# Patient Record
Sex: Male | Born: 1965 | Race: White | Hispanic: No | Marital: Married | Smoking: Current every day smoker
Health system: Southern US, Community
[De-identification: ages and names within clinical notes are randomized; demographics above are authoritative.]

## PROBLEM LIST (undated history)

## (undated) DIAGNOSIS — I10 Essential (primary) hypertension: Secondary | ICD-10-CM

## (undated) DIAGNOSIS — Z951 Presence of aortocoronary bypass graft: Secondary | ICD-10-CM

---

## 2011-11-13 ENCOUNTER — Emergency Department: Payer: Self-pay | Admitting: Emergency Medicine

## 2012-05-10 ENCOUNTER — Encounter: Payer: Self-pay | Admitting: Cardiothoracic Surgery

## 2012-05-12 ENCOUNTER — Encounter: Payer: Self-pay | Admitting: Cardiothoracic Surgery

## 2012-06-11 ENCOUNTER — Encounter: Payer: Self-pay | Admitting: Cardiothoracic Surgery
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
## 2015-04-16 ENCOUNTER — Encounter: Payer: Self-pay | Admitting: Emergency Medicine

## 2015-04-16 ENCOUNTER — Emergency Department: Payer: 59

## 2015-04-16 ENCOUNTER — Emergency Department
Admission: EM | Admit: 2015-04-16 | Discharge: 2015-04-16 | Disposition: A | Discharge status: Home / Self Care | Payer: 59 | Attending: Emergency Medicine | Admitting: Emergency Medicine

## 2015-04-16 DIAGNOSIS — S79921A Unspecified injury of right thigh, initial encounter: Secondary | ICD-10-CM | POA: Insufficient documentation

## 2015-04-16 DIAGNOSIS — Z720 Tobacco use: Secondary | ICD-10-CM | POA: Insufficient documentation

## 2015-04-16 DIAGNOSIS — W230XXA Caught, crushed, jammed, or pinched between moving objects, initial encounter: Secondary | ICD-10-CM | POA: Insufficient documentation

## 2015-04-16 DIAGNOSIS — S8991XA Unspecified injury of right lower leg, initial encounter: Secondary | ICD-10-CM | POA: Insufficient documentation

## 2015-04-16 DIAGNOSIS — Y9389 Activity, other specified: Secondary | ICD-10-CM | POA: Diagnosis not present

## 2015-04-16 DIAGNOSIS — T148 Other injury of unspecified body region: Secondary | ICD-10-CM | POA: Insufficient documentation

## 2015-04-16 DIAGNOSIS — Y92410 Unspecified street and highway as the place of occurrence of the external cause: Secondary | ICD-10-CM | POA: Diagnosis not present

## 2015-04-16 DIAGNOSIS — Y998 Other external cause status: Secondary | ICD-10-CM | POA: Insufficient documentation

## 2015-04-16 DIAGNOSIS — S79911A Unspecified injury of right hip, initial encounter: Secondary | ICD-10-CM | POA: Insufficient documentation

## 2015-04-16 DIAGNOSIS — S79912A Unspecified injury of left hip, initial encounter: Secondary | ICD-10-CM | POA: Diagnosis present

## 2015-04-16 DIAGNOSIS — T148XXA Other injury of unspecified body region, initial encounter: Secondary | ICD-10-CM

## 2015-04-16 DIAGNOSIS — I10 Essential (primary) hypertension: Secondary | ICD-10-CM | POA: Diagnosis not present

## 2015-04-16 HISTORY — DX: Essential (primary) hypertension: I10

## 2015-04-16 HISTORY — DX: Presence of aortocoronary bypass graft: Z95.1

## 2015-04-16 LAB — CK: CK TOTAL: 194 U/L (ref 49–397)

## 2015-04-16 MED ORDER — MORPHINE SULFATE 4 MG/ML IJ SOLN
INTRAMUSCULAR | Status: AC
  Administered 2015-04-16: 4 mg
  Filled 2015-04-16: qty 1

## 2015-04-16 MED ORDER — OXYCODONE-ACETAMINOPHEN 5-325 MG PO TABS: 1.0000 | ORAL_TABLET | Freq: Four times a day (QID) | ORAL | Status: AC | PRN

## 2015-04-16 MED ORDER — MORPHINE SULFATE 4 MG/ML IJ SOLN
6.0000 mg | Freq: Once | INTRAMUSCULAR | Status: AC
  Administered 2015-04-16: 4 mg via INTRAVENOUS

## 2015-04-16 MED ORDER — MORPHINE SULFATE 10 MG/ML IJ SOLN
INTRAMUSCULAR | Status: AC
  Administered 2015-04-16: 10 mg via INTRAVENOUS
  Filled 2015-04-16: qty 1

## 2015-04-16 MED ORDER — ONDANSETRON HCL 4 MG/2ML IJ SOLN
INTRAMUSCULAR | Status: AC
  Administered 2015-04-16: 4 mg
  Filled 2015-04-16: qty 2

## 2015-04-16 MED ORDER — MORPHINE SULFATE 2 MG/ML IJ SOLN
INTRAMUSCULAR | Status: AC
  Administered 2015-04-16: 2 mg via INTRAVENOUS
  Filled 2015-04-16: qty 1

## 2015-04-16 MED ORDER — MORPHINE SULFATE 10 MG/ML IJ SOLN
10.0000 mg | Freq: Once | INTRAMUSCULAR | Status: AC
  Administered 2015-04-16: 10 mg via INTRAVENOUS

## 2015-04-16 MED ORDER — MORPHINE SULFATE 4 MG/ML IJ SOLN
INTRAMUSCULAR | Status: AC
  Administered 2015-04-16: 4 mg via INTRAVENOUS
  Filled 2015-04-16: qty 1

## 2015-04-16 NOTE — ED Notes (Signed)
Pt states right knee tingling, full motor and sensation of right foot. Color WNL. Alert and oriented X4. RR even and unlabored. Pt undressed.
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
## 2015-04-16 NOTE — Discharge Instructions (Signed)
Contusion °A contusion is a deep bruise. Contusions are the result of an injury that caused bleeding under the skin. The contusion may turn blue, purple, or yellow. Minor injuries will give you a painless contusion, but more severe contusions may stay painful and swollen for a few weeks.  °CAUSES  °A contusion is usually caused by a blow, trauma, or direct force to an area of the body. °SYMPTOMS  °· Swelling and redness of the injured area. °· Bruising of the injured area. °· Tenderness and soreness of the injured area. °· Pain. °DIAGNOSIS  °The diagnosis can be made by taking a history and physical exam. An X-ray, CT scan, or MRI may be needed to determine if there were any associated injuries, such as fractures. °TREATMENT  °Specific treatment will depend on what area of the body was injured. In general, the best treatment for a contusion is resting, icing, elevating, and applying cold compresses to the injured area. Over-the-counter medicines may also be recommended for pain control. Ask your caregiver what the best treatment is for your contusion. °HOME CARE INSTRUCTIONS  °· Put ice on the injured area. °· Put ice in a plastic bag. °· Place a towel between your skin and the bag. °· Leave the ice on for 15-20 minutes, 3-4 times a day, or as directed by your health care provider. °· Only take over-the-counter or prescription medicines for pain, discomfort, or fever as directed by your caregiver. Your caregiver may recommend avoiding anti-inflammatory medicines (aspirin, ibuprofen, and naproxen) for 48 hours because these medicines may increase bruising. °· Rest the injured area. °· If possible, elevate the injured area to reduce swelling. °SEEK IMMEDIATE MEDICAL CARE IF:  °· You have increased bruising or swelling. °· You have pain that is getting worse. °· Your swelling or pain is not relieved with medicines. °MAKE SURE YOU:  °· Understand these instructions. °· Will watch your condition. °· Will get help right  away if you are not doing well or get worse. °Document Released: 09/07/2005 Document Revised: 12/03/2013 Document Reviewed: 10/03/2011 °[employer]® Patient Information ©2015 [employer]. This information is not intended to replace advice given to you by your health care provider. Make sure you discuss any questions you have with your health care provider. ° °Cryotherapy °Cryotherapy means treatment with cold. Ice or gel packs can be used to reduce both pain and swelling. Ice is the most helpful within the first 24 to 48 hours after an injury or flare-up from overusing a muscle or joint. Sprains, strains, spasms, burning pain, shooting pain, and aches can all be eased with ice. Ice can also be used when recovering from surgery. Ice is effective, has very few side effects, and is safe for most people to use. °PRECAUTIONS  °Ice is not a safe treatment option for people with: °· Raynaud phenomenon. This is a condition affecting small blood vessels in the extremities. Exposure to cold may cause your problems to return. °· Cold hypersensitivity. There are many forms of cold hypersensitivity, including: °¨ Cold urticaria. Red, itchy hives appear on the skin when the tissues begin to warm after being iced. °¨ Cold erythema. This is a red, itchy rash caused by exposure to cold. °¨ Cold hemoglobinuria. Red blood cells break down when the tissues begin to warm after being iced. The hemoglobin that carry oxygen are passed into the urine because they cannot combine with blood proteins fast enough. °· Numbness or altered sensitivity in the area being iced. °If you have any   of the following conditions, do not use ice until you have discussed cryotherapy with your caregiver:  Heart conditions, such as arrhythmia, angina, or chronic heart disease.  High blood pressure.  Healing wounds or open skin in the area being iced.  Current infections.  Rheumatoid arthritis.  Poor circulation.  Diabetes. Ice slows the blood flow  in the region it is applied. This is beneficial when trying to stop inflamed tissues from spreading irritating chemicals to surrounding tissues. However, if you expose your skin to cold temperatures for too long or without the proper protection, you can damage your skin or nerves. Watch for signs of skin damage due to cold. HOME CARE INSTRUCTIONS Follow these tips to use ice and cold packs safely.  Place a dry or damp towel between the ice and skin. A damp towel will cool the skin more quickly, so you may need to shorten the time that the ice is used.  For a more rapid response, add gentle compression to the ice.  Ice for no more than 10 to 20 minutes at a time. The bonier the area you are icing, the less time it will take to get the benefits of ice.  Check your skin after 5 minutes to make sure there are no signs of a poor response to cold or skin damage.  Rest 20 minutes or more between uses.  Once your skin is numb, you can end your treatment. You can test numbness by very lightly touching your skin. The touch should be so light that you do not see the skin dimple from the pressure of your fingertip. When using ice, most people will feel these normal sensations in this order: cold, burning, aching, and numbness.  Do not use ice on someone who cannot communicate their responses to pain, such as small children or people with dementia. HOW TO MAKE AN ICE PACK Ice packs are the most common way to use ice therapy. Other methods include ice massage, ice baths, and cryosprays. Muscle creams that cause a cold, tingly feeling do not offer the same benefits that ice offers and should not be used as a substitute unless recommended by your caregiver. To make an ice pack, do one of the following:  Place crushed ice or a bag of frozen vegetables in a sealable plastic bag. Squeeze out the excess air. Place this bag inside another plastic bag. Slide the bag into a pillowcase or place a damp towel between  your skin and the bag.  Mix 3 parts water with 1 part rubbing alcohol. Freeze the mixture in a sealable plastic bag. When you remove the mixture from the freezer, it will be slushy. Squeeze out the excess air. Place this bag inside another plastic bag. Slide the bag into a pillowcase or place a damp towel between your skin and the bag. SEEK MEDICAL CARE IF:  You develop white spots on your skin. This may give the skin a blotchy (mottled) appearance.  Your skin turns blue or pale.  Your skin becomes waxy or hard.  Your swelling gets worse. MAKE SURE YOU:   Understand these instructions.  Will watch your condition.  Will get help right away if you are not doing well or get worse. Document Released: 07/25/2011 Document Revised: 04/14/2014 Document Reviewed: 07/25/2011 [facility] Patient Information 2015 [place], [place]. This information is not intended to replace advice given to you by your health care provider. Make sure you discuss any questions you have with your health care provider. Please  use ice 20 minutes every hour or two. Do NOT fall asleep with the ice on, you can get burns. Rest, elevate the leg. Use crutches if you need to move around. Return for worse pain or if the leg gets hard or numb. Return or see Dr Real ConsKrazinski the orthopedic doctor if you are not better in 3-4 days.
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
